# Patient Record
Sex: Female | Born: 1990 | Race: White | Hispanic: No | Marital: Single | Smoking: Never smoker
Health system: Southern US, Community
[De-identification: ages and names within clinical notes are randomized; demographics above are authoritative.]

## PROBLEM LIST (undated history)

## (undated) DIAGNOSIS — R636 Underweight: Secondary | ICD-10-CM

## (undated) DIAGNOSIS — G43909 Migraine, unspecified, not intractable, without status migrainosus: Secondary | ICD-10-CM

---

## 2016-03-27 ENCOUNTER — Ambulatory Visit (HOSPITAL_COMMUNITY)
Admission: EM | Admit: 2016-03-27 | Discharge: 2016-03-27 | Disposition: A | Discharge status: Home / Self Care | Payer: BLUE CROSS/BLUE SHIELD

## 2016-03-27 ENCOUNTER — Encounter (HOSPITAL_COMMUNITY): Payer: Self-pay | Admitting: Emergency Medicine

## 2016-03-27 DIAGNOSIS — G43B0 Ophthalmoplegic migraine, not intractable: Secondary | ICD-10-CM | POA: Diagnosis not present

## 2016-03-27 DIAGNOSIS — R634 Abnormal weight loss: Secondary | ICD-10-CM

## 2016-03-27 DIAGNOSIS — R64 Cachexia: Secondary | ICD-10-CM | POA: Diagnosis not present

## 2016-03-27 HISTORY — DX: Migraine, unspecified, not intractable, without status migrainosus: G43.909

## 2016-03-27 HISTORY — DX: Underweight: R63.6

## 2016-03-27 MED ORDER — KETOROLAC TROMETHAMINE 30 MG/ML IJ SOLN
INTRAMUSCULAR | Status: AC
  Filled 2016-03-27: qty 1

## 2016-03-27 MED ORDER — DIPHENHYDRAMINE HCL 50 MG/ML IJ SOLN
INTRAMUSCULAR | Status: AC
  Filled 2016-03-27: qty 1

## 2016-03-27 MED ORDER — DEXAMETHASONE SODIUM PHOSPHATE 10 MG/ML IJ SOLN
10.0000 mg | Freq: Once | INTRAMUSCULAR | Status: AC
  Administered 2016-03-27: 10 mg via INTRAMUSCULAR

## 2016-03-27 MED ORDER — KETOROLAC TROMETHAMINE 30 MG/ML IJ SOLN
30.0000 mg | Freq: Once | INTRAMUSCULAR | Status: AC
  Administered 2016-03-27: 30 mg via INTRAMUSCULAR

## 2016-03-27 MED ORDER — ONDANSETRON HCL 4 MG PO TABS: 4.0000 mg | ORAL_TABLET | Freq: Four times a day (QID) | ORAL | 0 refills | Status: AC

## 2016-03-27 MED ORDER — DIPHENHYDRAMINE HCL 50 MG/ML IJ SOLN
25.0000 mg | Freq: Once | INTRAMUSCULAR | Status: AC
  Administered 2016-03-27: 25 mg via INTRAMUSCULAR

## 2016-03-27 MED ORDER — DEXAMETHASONE SODIUM PHOSPHATE 10 MG/ML IJ SOLN
INTRAMUSCULAR | Status: AC
  Filled 2016-03-27: qty 1

## 2016-03-27 NOTE — ED Provider Notes (Signed)
CSN: [id number]     Arrival date & time 03/27/16  1851 History   First MD Initiated Contact with Patient 03/27/16 1921     Chief Complaint  Patient presents with  . Migraine   (Consider location/radiation/quality/duration/timing/severity/associated sxs/prior Treatment) HPI 25 y/o female with several day history of migraine. Causing some vomiting. Had some confusion a few days ago, but that has passed. 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  Pain score is 7 at this time.  Vomited about 1 hour ago.  Also c/o weight loss, down to 85 pounds. Does not have a pcp. Past Medical History:  Diagnosis Date  . Migraines   . Underweight    History reviewed. No pertinent surgical history. History reviewed. No pertinent family history. Social History  Substance Use Topics  . Smoking status: Never Smoker  . Smokeless tobacco: Never Used  . Alcohol use Yes   OB History    No data available     Review of Systems  Denies:  ABDOMINAL PAIN, CHEST PAIN, CONGESTION, DYSURIA, SHORTNESS OF BREATH  Allergies  Review of patient's allergies indicates no known allergies.  Home Medications   Prior to Admission medications   Medication Sig Start Date End Date Taking? Authorizing Provider  FLUoxetine (PROZAC) 20 MG capsule Take 20 mg by mouth daily.   Yes Historical Provider, MD  hydrOXYzine (ATARAX/VISTARIL) 10 MG tablet Take 10 mg by mouth 3 (three) times daily as needed.   Yes Historical Provider, MD  norethindrone (MICRONOR,CAMILA,ERRIN) 0.35 MG tablet Take 1 tablet by mouth daily.   Yes Historical Provider, MD   Meds Ordered and Administered this Visit   Medications  diphenhydrAMINE (BENADRYL) injection 25 mg (25 mg Intramuscular Given 03/27/16 2028)  dexamethasone (DECADRON) injection 10 mg (10 mg Intramuscular Given 03/27/16 2029)  ketorolac (TORADOL) 30 MG/ML injection 30 mg (30 mg Intramuscular Given 03/27/16 2028)  after meds, patient is feeling a bit better.   BP  121/83 (BP Location: Left Arm)   Pulse 70   Temp 97.7 F (36.5 C) (Oral)   Resp 16   SpO2 99%  No data found.   Physical Exam NURSES NOTES AND VITAL SIGNS REVIEWED. CONSTITUTIONAL: Well developed, well nourished, no acute distress HEENT: normocephalic, atraumatic EYES: Conjunctiva normal NECK:normal ROM, supple, no adenopathy PULMONARY:No respiratory distress, normal effort ABDOMINAL: Soft, ND, NT BS+, No CVAT MUSCULOSKELETAL: Normal ROM of all extremities,  SKIN: warm and dry without rash PSYCHIATRIC: Mood and affect, behavior are normal  Urgent Care Course   Clinical Course    Procedures (including critical care time)  Labs Review Labs Reviewed - No data to display  Imaging Review No results found.   Visual Acuity Review  Right Eye Distance:   Left Eye Distance:   Bilateral Distance:    Right Eye Near:   Left Eye Near:    Bilateral Near:        Zofran, PCP follow up return to work provided.  MDM   1. Ophthalmoplegic migraine, not intractable   2. Weight loss   3. Cachexia [facility](HCC)     Patient is reassured that there are no issues that require transfer to higher level of care at this time or additional tests. Patient is advised to continue home symptomatic treatment. Patient is advised that if there are new or worsening symptoms to attend the emergency department, contact primary care provider, or return to UC. Instructions of care provided discharged home in stable condition.    THIS  NOTE WAS GENERATED USING A VOICE RECOGNITION SOFTWARE PROGRAM. ALL REASONABLE EFFORTS  WERE MADE TO PROOFREAD THIS DOCUMENT FOR ACCURACY.  I have verbally reviewed the discharge instructions with the patient. A printed AVS was given to the patient.  All questions were answered prior to discharge.      Tharon AquasFrank C Valeska Haislip, PA 03/27/16 2043    Tharon AquasFrank C Ramari Bray, [place] 03/27/16 2053

## 2016-03-27 NOTE — ED Triage Notes (Signed)
Patient reports a long history of migraines.  This headache started 3 hours ago.  Reports having similar one 2 days ago that went away with sleep.  Reports that headache and this headache have been worse than her usual migraines.  Patient is vague with responses

## 2016-04-29 ENCOUNTER — Other Ambulatory Visit: Payer: Self-pay | Admitting: Physician Assistant

## 2016-04-29 DIAGNOSIS — R634 Abnormal weight loss: Secondary | ICD-10-CM

## 2016-04-29 DIAGNOSIS — R112 Nausea with vomiting, unspecified: Secondary | ICD-10-CM

## 2016-05-03 ENCOUNTER — Other Ambulatory Visit: Payer: BLUE CROSS/BLUE SHIELD

## 2016-05-10 ENCOUNTER — Other Ambulatory Visit: Payer: BLUE CROSS/BLUE SHIELD

## 2016-05-30 ENCOUNTER — Emergency Department
Admission: EM | Admit: 2016-05-30 | Discharge: 2016-05-30 | Disposition: A | Discharge status: Home / Self Care | Payer: BLUE CROSS/BLUE SHIELD | Attending: Emergency Medicine | Admitting: Emergency Medicine

## 2016-05-30 DIAGNOSIS — F4321 Adjustment disorder with depressed mood: Secondary | ICD-10-CM | POA: Diagnosis not present

## 2016-05-30 DIAGNOSIS — Z79899 Other long term (current) drug therapy: Secondary | ICD-10-CM | POA: Diagnosis not present

## 2016-05-30 DIAGNOSIS — F410 Panic disorder [episodic paroxysmal anxiety] without agoraphobia: Secondary | ICD-10-CM | POA: Diagnosis present

## 2016-05-30 LAB — URINE DRUG SCREEN, QUALITATIVE (ARMC ONLY)
Amphetamines, Ur Screen: NOT DETECTED
BENZODIAZEPINE, UR SCRN: NOT DETECTED
Barbiturates, Ur Screen: NOT DETECTED
CANNABINOID 50 NG, UR ~~LOC~~: POSITIVE — AB
Cocaine Metabolite,Ur ~~LOC~~: NOT DETECTED
MDMA (ECSTASY) UR SCREEN: NOT DETECTED
Methadone Scn, Ur: NOT DETECTED
OPIATE, UR SCREEN: NOT DETECTED
PHENCYCLIDINE (PCP) UR S: NOT DETECTED
Tricyclic, Ur Screen: NOT DETECTED

## 2016-05-30 LAB — COMPREHENSIVE METABOLIC PANEL
ALK PHOS: 54 U/L (ref 38–126)
ALT: 17 U/L (ref 14–54)
ANION GAP: 6 (ref 5–15)
AST: 22 U/L (ref 15–41)
Albumin: 4.4 g/dL (ref 3.5–5.0)
BUN: 10 mg/dL (ref 6–20)
CALCIUM: 8.8 mg/dL — AB (ref 8.9–10.3)
CHLORIDE: 108 mmol/L (ref 101–111)
CO2: 25 mmol/L (ref 22–32)
Creatinine, Ser: 0.65 mg/dL (ref 0.44–1.00)
Glucose, Bld: 142 mg/dL — ABNORMAL HIGH (ref 65–99)
Potassium: 3.7 mmol/L (ref 3.5–5.1)
SODIUM: 139 mmol/L (ref 135–145)
Total Bilirubin: 0.6 mg/dL (ref 0.3–1.2)
Total Protein: 7.4 g/dL (ref 6.5–8.1)

## 2016-05-30 LAB — URINALYSIS COMPLETE WITH MICROSCOPIC (ARMC ONLY)
Bilirubin Urine: NEGATIVE
Glucose, UA: NEGATIVE mg/dL
HGB URINE DIPSTICK: NEGATIVE
KETONES UR: NEGATIVE mg/dL
LEUKOCYTES UA: NEGATIVE
Nitrite: NEGATIVE
PROTEIN: NEGATIVE mg/dL
RBC / HPF: NONE SEEN RBC/hpf (ref 0–5)
SPECIFIC GRAVITY, URINE: 1.018 (ref 1.005–1.030)
WBC UA: NONE SEEN WBC/hpf (ref 0–5)
pH: 7 (ref 5.0–8.0)

## 2016-05-30 LAB — CBC
HCT: 43 % (ref 35.0–47.0)
Hemoglobin: 14.5 g/dL (ref 12.0–16.0)
MCH: 32.2 pg (ref 26.0–34.0)
MCHC: 33.6 g/dL (ref 32.0–36.0)
MCV: 95.8 fL (ref 80.0–100.0)
PLATELETS: 240 10*3/uL (ref 150–440)
RBC: 4.49 MIL/uL (ref 3.80–5.20)
RDW: 13.6 % (ref 11.5–14.5)
WBC: 9.5 10*3/uL (ref 3.6–11.0)

## 2016-05-30 LAB — SALICYLATE LEVEL: Salicylate Lvl: <7 mg/dL (ref 2.8–30.0)

## 2016-05-30 LAB — ETHANOL

## 2016-05-30 LAB — POCT PREGNANCY, URINE: PREG TEST UR: NEGATIVE

## 2016-05-30 LAB — ACETAMINOPHEN LEVEL

## 2016-05-30 MED ORDER — SODIUM CHLORIDE 0.9 % IV BOLUS (SEPSIS)
1000.0000 mL | Freq: Once | INTRAVENOUS | Status: AC
  Administered 2016-05-30: 1000 mL via INTRAVENOUS

## 2016-05-30 NOTE — ED Notes (Addendum)
Pt returns to triage, st "I'm just shaking real bad; I been going thru a lot"; pt does not elaborate and does not answer further questioning

## 2016-05-30 NOTE — ED Notes (Signed)
Report received from Butch, RN 

## 2016-05-30 NOTE — ED Notes (Signed)
Pt's sister st she has been upset for last few weeks after a 5year relationship breakup; has been having panic attacks and today seemed worse with no known trigger; has hx anxiety

## 2016-05-30 NOTE — ED Notes (Signed)
NAD noted at time of D/C. Pt denies questions or concerns. Pt ambulatory to the lobby at this time.  

## 2016-05-30 NOTE — ED Notes (Signed)
PT escorted to the lobby by this RN, D/C into the care of her father.

## 2016-05-30 NOTE — ED Notes (Signed)
Father stated that patient has been using alcohol and marijuana today. He is unsure of regular use.

## 2016-05-30 NOTE — ED Triage Notes (Addendum)
Pt brought in by female who st pt is anxious; pt brought to triage via w/c with no distress noted; pt is unwilling to speak with nurse at this time despite numerous questioning regarding why she needs to be seen; pt returns to lobby and instructed friend to notify nurse when she is ready to be seen

## 2016-05-30 NOTE — ED Provider Notes (Signed)
[facility] Emergency Department Provider Note   First MD Initiated Contact with Patient 05/30/16 [phone]     (approximate)  I have reviewed the triage vital signs and the nursing notes.   HISTORY  Chief Complaint Panic Attack   HPI Natalie Huerta is a 25 y.o. female presents with feeling anxious after smoking marijuana approximately 11:00 PM last night. Patient does however admit to feelings of depression decreased appetite since breaking up with her boyfriend of 5-1/2 years approximately one month ago. Patient denies any suicidal or homicidal ideation. Patient admits to using LSD approximately 3 weeks ago following the breakup of her boyfriend.   Past Medical History:  Diagnosis Date  . Migraines   . Underweight     There are no active problems to display for this patient.   No past surgical history on file.  Prior to Admission medications   Medication Sig Start Date End Date Taking? Authorizing Provider  FLUoxetine (PROZAC) 20 MG capsule Take 20 mg by mouth daily.    Historical Provider, MD  hydrOXYzine (ATARAX/VISTARIL) 10 MG tablet Take 10 mg by mouth 3 (three) times daily as needed.    Historical Provider, MD  norethindrone (MICRONOR,CAMILA,ERRIN) 0.35 MG tablet Take 1 tablet by mouth daily.    Historical Provider, MD  ondansetron (ZOFRAN) 4 MG tablet Take 1 tablet (4 mg total) by mouth every 6 (six) hours. 03/27/16   Tharon Aquas, PA    Allergies Review of patient's allergies indicates no known allergies.  No family history on file.  Social History Social History  Substance Use Topics  . Smoking status: Never Smoker  . Smokeless tobacco: Never Used  . Alcohol use Yes    Review of Systems Constitutional: No fever/chills Eyes: No visual changes. ENT: No sore throat. Cardiovascular: Denies chest pain. Respiratory: Denies shortness of breath. Gastrointestinal: No abdominal pain.  No nausea, no vomiting.  No diarrhea.  No  constipation. Genitourinary: Negative for dysuria. Musculoskeletal: Negative for back pain. Skin: Negative for rash. Neurological: Negative for headaches, focal weakness or numbness. Psychiatric:Positive for depression  10-point ROS otherwise negative.  ____________________________________________   PHYSICAL EXAM:  VITAL SIGNS: ED Triage Vitals [05/30/16 0204]  Enc Vitals Group     BP (!) 145/78     Pulse Rate (!) 120     Resp 18     Temp 98.5 F (36.9 C)     Temp Source Oral     SpO2 99 %     Weight      Height      Head Circumference      Peak Flow      Pain Score      Pain Loc      Pain Edu?      Excl. in GC?     Constitutional: Alert and oriented. Well appearing and in no acute distress. Eyes: Conjunctivae are normal. PERRL. EOMI. Head: Atraumatic. Mouth/Throat: Mucous membranes are moist.  Oropharynx non-erythematous. Neck: No stridor.  No meningeal signs.   Cardiovascular: Normal rate, regular rhythm. Good peripheral circulation. Grossly normal heart sounds. Respiratory: Normal respiratory effort.  No retractions. Lungs CTAB. Gastrointestinal: Soft and nontender. No distention.  Musculoskeletal: No lower extremity tenderness nor edema. No gross deformities of extremities. Neurologic:  Normal speech and language. No gross focal neurologic deficits are appreciated.  Skin:  Skin is warm, dry and intact. No rash noted. Psychiatric: Depressed mood. Speech and behavior are normal.  ____________________________________________   LABS (all labs ordered  are listed, but only abnormal results are displayed)  Labs Reviewed  URINALYSIS COMPLETEWITH MICROSCOPIC ([facility] ONLY) - Abnormal; Notable for the following:       Result Value   Color, Urine YELLOW (*)    APPearance TURBID (*)    Bacteria, UA RARE (*)    Squamous Epithelial / LPF 0-5 (*)    All other components within normal limits  URINE DRUG SCREEN, QUALITATIVE ([facility] ONLY) - Abnormal; Notable for the  following:    Cannabinoid 50 Ng, Ur [place] POSITIVE (*)    All other components within normal limits  COMPREHENSIVE METABOLIC PANEL - Abnormal; Notable for the following:    Glucose, Bld 142 (*)    Calcium 8.8 (*)    All other components within normal limits  ACETAMINOPHEN LEVEL - Abnormal; Notable for the following:    Acetaminophen (Tylenol), Serum <10 (*)    All other components within normal limits  ETHANOL  CBC  SALICYLATE LEVEL  POC URINE PREG, ED  POCT PREGNANCY, URINE   _   Procedures      INITIAL IMPRESSION / ASSESSMENT AND PLAN / ED COURSE  Pertinent labs & imaging results that were available during my care of the patient were reviewed by me and considered in my medical decision making (see chart for details).  History physical exam consistent with adjustment disorder and substance abuse. Patient evaluated by psychiatrist on call physician who recommended discharge and outpatient follow-up.   Clinical Course    ____________________________________________  FINAL CLINICAL IMPRESSION(S) / ED DIAGNOSES  Final diagnoses:  Adjustment disorder with depressed mood     MEDICATIONS GIVEN DURING THIS VISIT:  Medications  sodium chloride 0.9 % bolus 1,000 mL (0 mLs Intravenous Stopped 05/30/16 0539)     NEW OUTPATIENT MEDICATIONS STARTED DURING THIS VISIT:  New Prescriptions   No medications on file    Modified Medications   No medications on file    Discontinued Medications   No medications on file     Note:  This document was prepared using Dragon voice recognition software and may include unintentional dictation errors.    Darci Currentandolph N Brown, MD 05/30/16 [phone]

## 2016-05-30 NOTE — BH Assessment (Signed)
Assessment Note  Natalie Huerta is an 25 y.o. female presenting to the ED with concerns of worsening panic and anxiety attacks.  Patient also reports increasing symptoms of depression.  Patient states that she and her boyfriend of five years recently broke up.  She states that she has not been able to go to sleep and a loss of appetite.  She reports not feeling like doing much of anything such as go to work or school.  Patient does not have a history of drug use but does admit to using LSD and marijuana last night.  She denies any SI/HI or auditory/visual hallucinations.  She denies any previous mental health treatment or hospitalizations.  Diagnosis: Panic, Anxiety Disorder  Past Medical History:  Past Medical History:  Diagnosis Date  . Migraines   . Underweight     No past surgical history on file.  Family History: No family history on file.  Social History:  reports that she has never smoked. She has never used smokeless tobacco. She reports that she drinks alcohol. She reports that she does not use drugs.  Additional Social History:  Alcohol / Drug Use History of alcohol / drug use?: No history of alcohol / drug abuse (Patient reports recently using LSD and marijuana.)  CIWA: CIWA-Ar BP: (!) 145/78 Pulse Rate: (!) 120 COWS:    Allergies: No Known Allergies  Home Medications:  (Not in a hospital admission)  OB/GYN Status:  No LMP recorded. Patient is not currently having periods (Reason: Oral contraceptives).  General Assessment Data Location of Assessment: [facility] ED TTS Assessment: In system Is this a Tele or Face-to-Face Assessment?: Face-to-Face Is this an Initial Assessment or a Re-assessment for this encounter?: Initial Assessment Marital status: Single Maiden name: n/a Is patient pregnant?: No Pregnancy Status: No Living Arrangements: Alone Can pt return to current living arrangement?: Yes Admission Status: Voluntary Is patient capable of signing voluntary  admission?: Yes Referral Source: Self/Family/Friend Insurance type: Scientist, research (physical sciences) Exam [facility] Walk-in ONLY) Medical Exam completed: Yes  Crisis Care Plan Living Arrangements: Alone Legal Guardian: Other: (self) Name of Psychiatrist: none reported Name of Therapist: none reported  Education Status Is patient currently in school?: No Current Grade: college Highest grade of school patient has completed: n/a Name of school: n/a Contact person: n/a  Risk to self with the past 6 months Suicidal Ideation: No Has patient been a risk to self within the past 6 months prior to admission? : No Suicidal Intent: No Has patient had any suicidal intent within the past 6 months prior to admission? : No Is patient at risk for suicide?: No Suicidal Plan?: No Has patient had any suicidal plan within the past 6 months prior to admission? : No Access to Means: No What has been your use of drugs/alcohol within the last 12 months?: LSD and marijuana Previous Attempts/Gestures: No How many times?: 0 Other Self Harm Risks: not eating Triggers for Past Attempts: None known Intentional Self Injurious Behavior: None Family Suicide History: No Recent stressful life event(s): Loss (Comment) (Break up with boyfriend) Persecutory voices/beliefs?: No Depression: Yes Depression Symptoms: Despondent, Insomnia, Tearfulness, Loss of interest in usual pleasures, Feeling worthless/self pity Substance abuse history and/or treatment for substance abuse?: Yes Suicide prevention information given to non-admitted patients: Not applicable  Risk to Others within the past 6 months Homicidal Ideation: No Does patient have any lifetime risk of violence toward others beyond the six months prior to admission? : No Thoughts of Harm to Others: No  Current Homicidal Intent: No Current Homicidal Plan: No Access to Homicidal Means: No Identified Victim: Nne identified History of harm to others?: No Assessment of  Violence: None Noted Violent Behavior Description: None identified Does patient have access to weapons?: No Criminal Charges Pending?: No Does patient have a court date: No Is patient on probation?: No  Psychosis Hallucinations: None noted Delusions: None noted  Mental Status Report Appearance/Hygiene: Unremarkable Eye Contact: Good Motor Activity: Freedom of movement Speech: Logical/coherent Level of Consciousness: Alert, Crying Mood: Depressed, Anxious, Sad Affect: Anxious, Depressed, Sad Anxiety Level: Minimal Thought Processes: Relevant, Coherent Judgement: Partial Orientation: Person, Place, Time, Situation, Appropriate for developmental age Obsessive Compulsive Thoughts/Behaviors: None  Cognitive Functioning Concentration: Normal Memory: Recent Intact, Remote Intact IQ: Average Insight: Fair Impulse Control: Fair Appetite: Poor Weight Gain: 0 Sleep: Decreased Total Hours of Sleep: 5 Vegetative Symptoms: None  ADLScreening [facility] Assessment Services) Patient's cognitive ability adequate to safely complete daily activities?: Yes Patient able to express need for assistance with ADLs?: Yes Independently performs ADLs?: Yes (appropriate for developmental age)  Prior Inpatient Therapy Prior Inpatient Therapy: No Prior Therapy Dates: n/a Prior Therapy Facilty/Provider(s): n/a Reason for Treatment: n/a  Prior Outpatient Therapy Prior Outpatient Therapy: No Prior Therapy Dates: n/a Prior Therapy Facilty/Provider(s): n/a Reason for Treatment: n/a Does patient have an ACCT team?: No Does patient have Intensive In-House Services?  : No Does patient have [facility] services? : No Does patient have P4CC services?: No  ADL Screening (condition at time of admission) Patient's cognitive ability adequate to safely complete daily activities?: Yes Patient able to express need for assistance with ADLs?: Yes Independently performs ADLs?: Yes (appropriate for developmental  age)       Abuse/Neglect Assessment (Assessment to be complete while patient is alone) Physical Abuse: Denies Verbal Abuse: Denies Sexual Abuse: Denies Exploitation of patient/patient's resources: Denies Self-Neglect: Denies Values / Beliefs Cultural Requests During Hospitalization: None Spiritual Requests During Hospitalization: None Consults Spiritual Care Consult Needed: No Social Work Consult Needed: No      Additional Information 1:1 In Past 12 Months?: No CIRT Risk: No Elopement Risk: No Does patient have medical clearance?: Yes     Disposition:  Disposition Initial Assessment Completed for this Encounter: Yes Disposition of Patient: Other dispositions Other disposition(s): Other (Comment) (Pending Psych MD consult)  On Site Evaluation by:   Reviewed with Physician:    Artist Beachoxana C Gilberte Gorley 05/30/2016 6:57 AM

## 2016-05-30 NOTE — ED Notes (Signed)
This RN spoke with charge RN. Per pt, her father will be here to pick her up at approx 0945. Per Charge RN okay to have patient continue to stay in [place] area until the time her ride arrives. Pt placed in hall bed due to being in her personal clothes, pt wanded by security at this time per Charge RN request.

## 2016-06-10 ENCOUNTER — Ambulatory Visit
Admission: RE | Admit: 2016-06-10 | Discharge: 2016-06-10 | Disposition: A | Discharge status: Home / Self Care | Payer: BLUE CROSS/BLUE SHIELD | Source: Ambulatory Visit | Attending: Physician Assistant | Admitting: Physician Assistant

## 2016-06-10 DIAGNOSIS — R634 Abnormal weight loss: Secondary | ICD-10-CM

## 2016-06-10 DIAGNOSIS — R112 Nausea with vomiting, unspecified: Secondary | ICD-10-CM

## 2017-08-19 IMAGING — US US ABDOMEN COMPLETE
1 series · 14 of 25 positions shown · non-contrast
Comparison: None in PACs

CLINICAL DATA: Weight loss with nausea and vomiting for the past 4
months

EXAM:
ABDOMEN ULTRASOUND COMPLETE

[Series 1: us abdomen complete · 0.18mm/px · 14 of 88 slices shown]
[im 1/88]
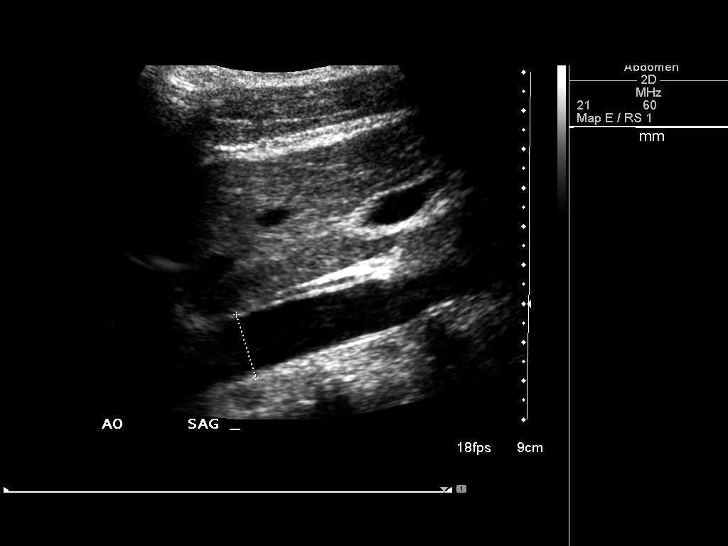
[im 8/88]
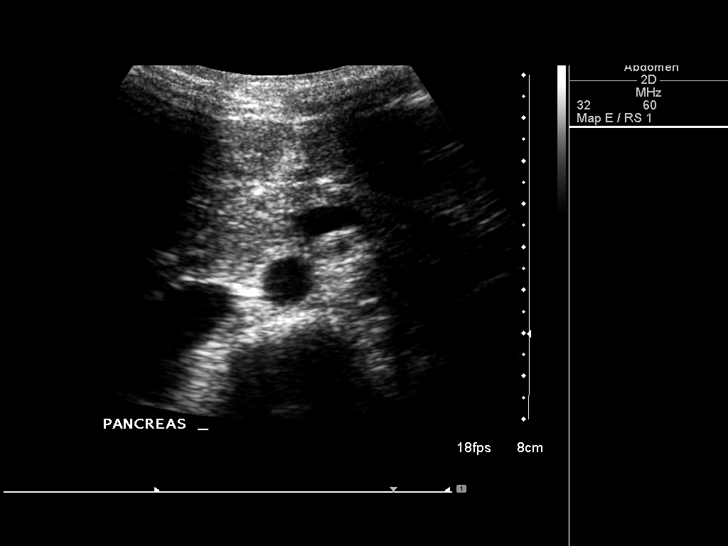
[im 15/88]
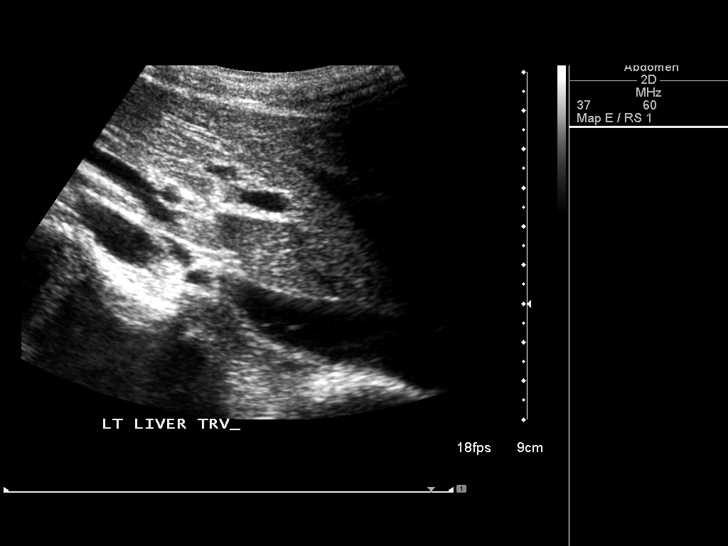
[im 22/88]
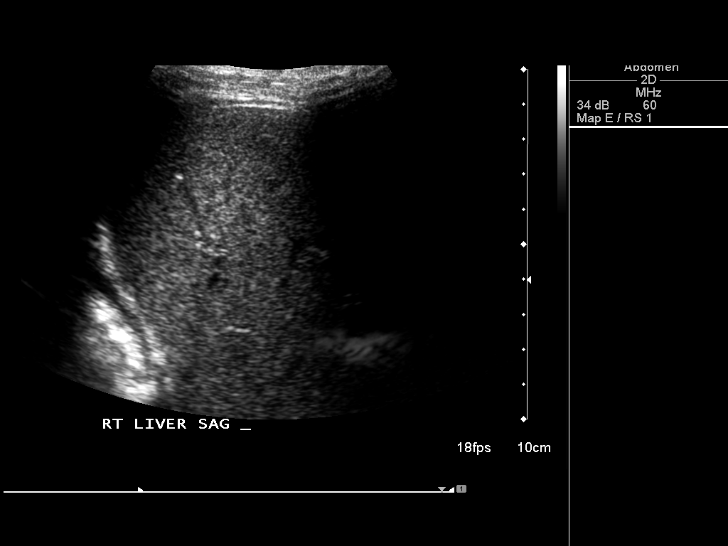
[im 30/88]
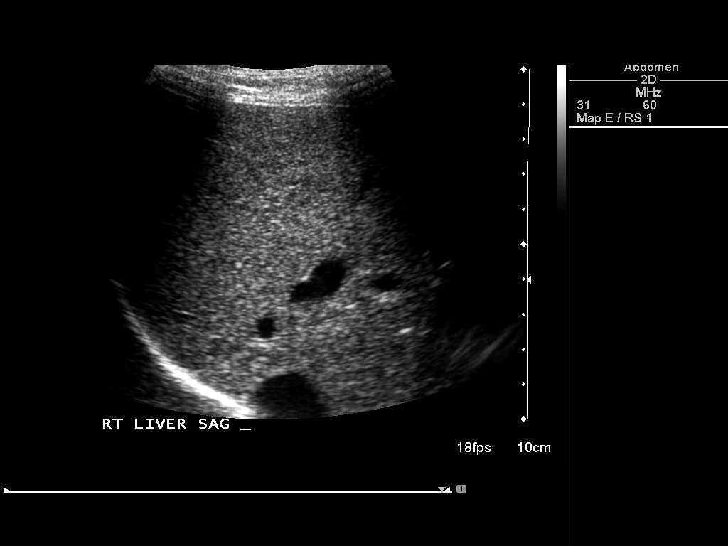
[im 33/88]
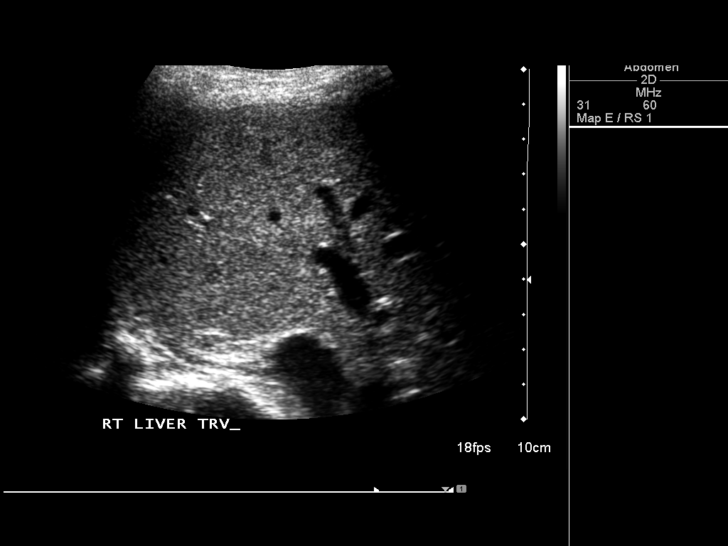
[im 40/88]
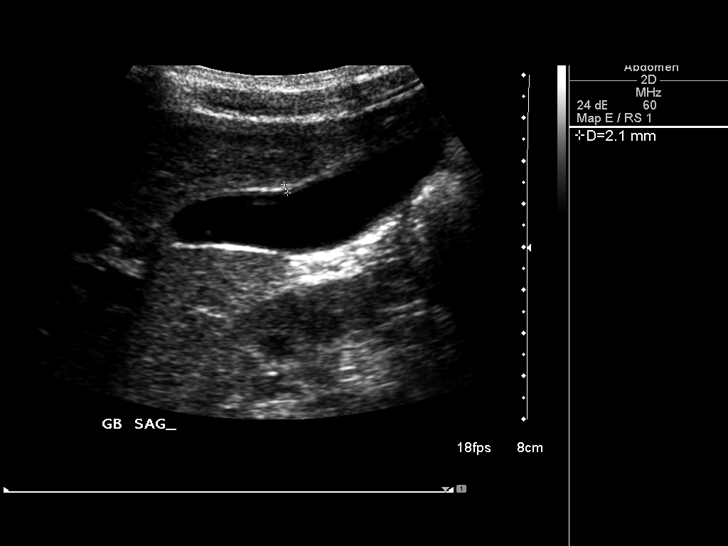
[im 48/88]
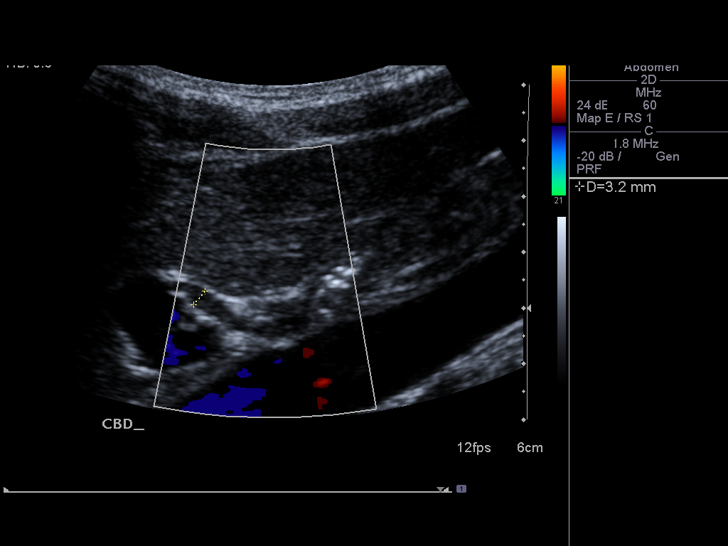
[im 55/88]
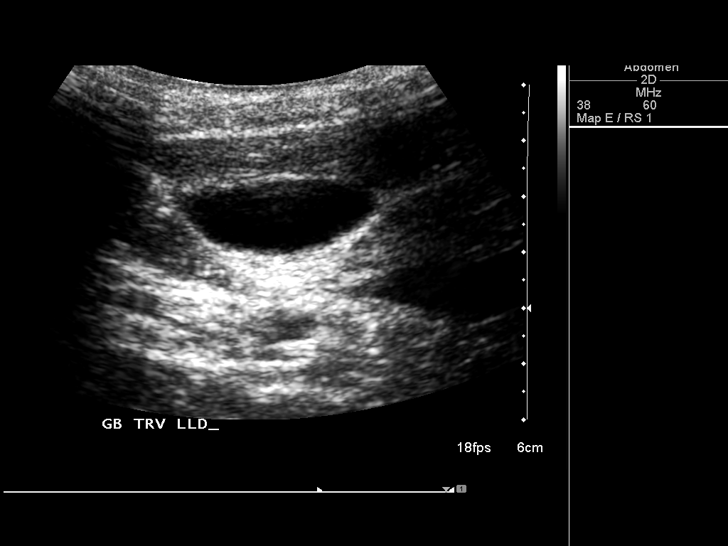
[im 59/88]
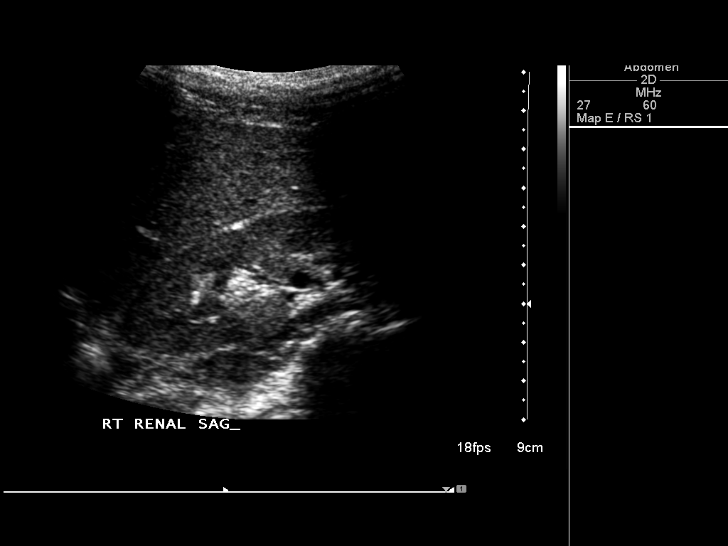
[im 66/88]
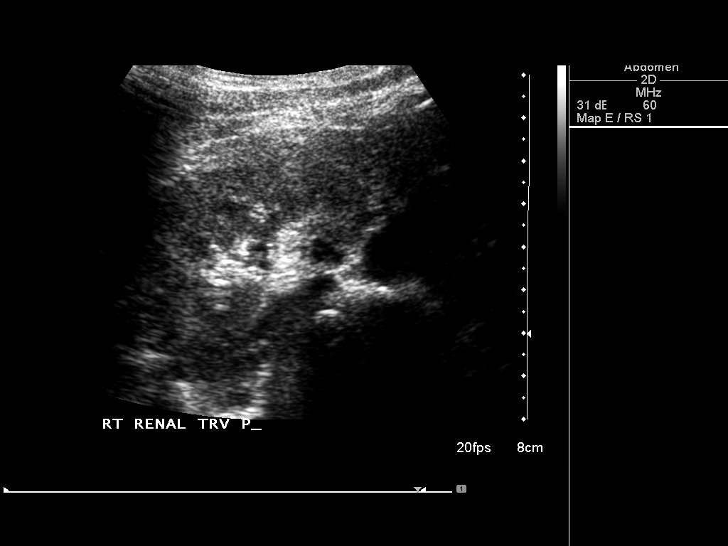
[im 73/88]
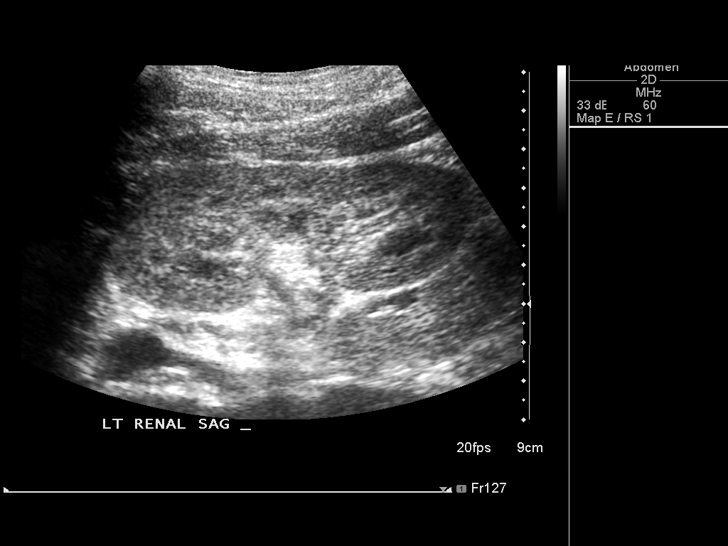
[im 80/88]
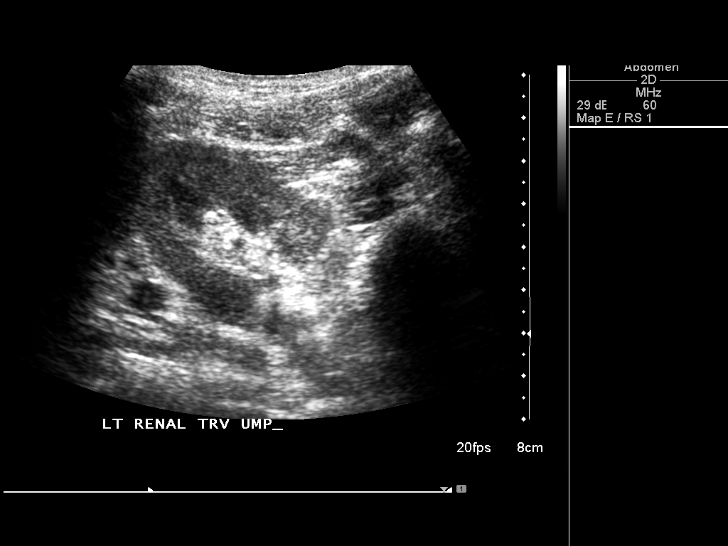
[im 88/88]
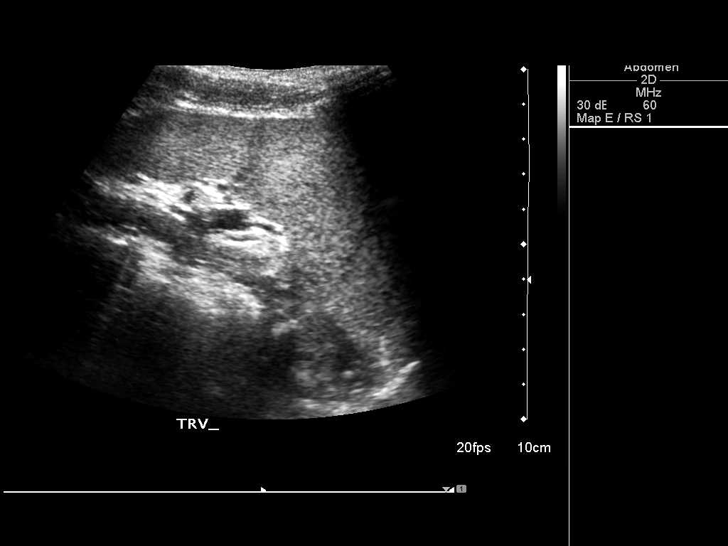

[14 of 25 positions shown; findings below may reference images not displayed]

FINDINGS: Gallbladder: No gallstones or wall thickening visualized. No
sonographic Murphy sign noted by sonographer.

Common bile duct: Diameter: 3.2 mm

Liver: No focal lesion identified. Within normal limits in
parenchymal echogenicity.

IVC: No abnormality visualized.

Pancreas: Visualized portion unremarkable.

Spleen: Size and appearance within normal limits.

Right Kidney: Length: 10 cm. Echogenicity within normal limits. No
mass or hydronephrosis visualized.

Left Kidney: Length: 9.7 cm. Echogenicity within normal limits. No
mass or hydronephrosis visualized.

Abdominal aorta: No aneurysm visualized.

Other findings: There is no ascites.
IMPRESSION: 1. No gallstones or sonographic evidence of acute cholecystitis. If
chronic cholecystitis is suspected clinically, a nuclear medicine
hepatobiliary scan with gallbladder ejection fraction determination
may be useful.
2. Normal appearance of the liver and pancreas.
3. No acute abnormality observed within the abdomen.
